# Patient Record
Sex: Female | Born: 1995 | Race: Black or African American | Hispanic: No | Marital: Single | Smoking: Never smoker
Health system: Southern US, Community
[De-identification: ages and names within clinical notes are randomized; demographics above are authoritative.]

## PROBLEM LIST (undated history)

## (undated) DIAGNOSIS — O039 Complete or unspecified spontaneous abortion without complication: Secondary | ICD-10-CM

## (undated) DIAGNOSIS — D4989 Neoplasm of unspecified behavior of other specified sites: Secondary | ICD-10-CM

## (undated) DIAGNOSIS — J302 Other seasonal allergic rhinitis: Secondary | ICD-10-CM

## (undated) DIAGNOSIS — I10 Essential (primary) hypertension: Secondary | ICD-10-CM

## (undated) DIAGNOSIS — K5900 Constipation, unspecified: Secondary | ICD-10-CM

## (undated) HISTORY — DX: Neoplasm of unspecified behavior of other specified sites: D49.89

## (undated) HISTORY — PX: TUMOR REMOVAL: SHX12

## (undated) HISTORY — PX: APPENDECTOMY: SHX54

## (undated) HISTORY — PX: OTHER SURGICAL HISTORY: SHX169

## (undated) HISTORY — PX: ANKLE SURGERY: SHX546

## (undated) HISTORY — PX: PORTACATH PLACEMENT: SHX2246

## (undated) HISTORY — DX: Complete or unspecified spontaneous abortion without complication: O03.9

## (undated) HISTORY — PX: TONSILLECTOMY: SUR1361

## (undated) HISTORY — DX: Essential (primary) hypertension: I10

## (undated) HISTORY — PX: PORT-A-CATH REMOVAL: SHX5289

---
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
## 2017-07-07 DIAGNOSIS — S93402A Sprain of unspecified ligament of left ankle, initial encounter: Secondary | ICD-10-CM | POA: Diagnosis not present

## 2017-07-07 DIAGNOSIS — M25572 Pain in left ankle and joints of left foot: Secondary | ICD-10-CM | POA: Diagnosis not present

## 2017-07-07 DIAGNOSIS — S93492A Sprain of other ligament of left ankle, initial encounter: Secondary | ICD-10-CM | POA: Diagnosis not present

## 2017-07-29 DIAGNOSIS — S93492D Sprain of other ligament of left ankle, subsequent encounter: Secondary | ICD-10-CM | POA: Diagnosis not present

## 2018-06-10 DIAGNOSIS — Z793 Long term (current) use of hormonal contraceptives: Secondary | ICD-10-CM | POA: Diagnosis not present

## 2018-06-10 DIAGNOSIS — J309 Allergic rhinitis, unspecified: Secondary | ICD-10-CM | POA: Diagnosis not present

## 2018-06-10 DIAGNOSIS — R7309 Other abnormal glucose: Secondary | ICD-10-CM | POA: Diagnosis not present

## 2018-06-10 DIAGNOSIS — Z7251 High risk heterosexual behavior: Secondary | ICD-10-CM | POA: Diagnosis not present

## 2018-06-10 DIAGNOSIS — E785 Hyperlipidemia, unspecified: Secondary | ICD-10-CM | POA: Diagnosis not present

## 2018-07-27 DIAGNOSIS — M546 Pain in thoracic spine: Secondary | ICD-10-CM | POA: Diagnosis not present

## 2018-11-18 DIAGNOSIS — J9801 Acute bronchospasm: Secondary | ICD-10-CM | POA: Diagnosis not present

## 2018-11-18 DIAGNOSIS — J209 Acute bronchitis, unspecified: Secondary | ICD-10-CM | POA: Diagnosis not present

## 2018-11-18 DIAGNOSIS — J029 Acute pharyngitis, unspecified: Secondary | ICD-10-CM | POA: Diagnosis not present

## 2018-12-01 DIAGNOSIS — S61216A Laceration without foreign body of right little finger without damage to nail, initial encounter: Secondary | ICD-10-CM | POA: Diagnosis not present

## 2018-12-07 DIAGNOSIS — H66002 Acute suppurative otitis media without spontaneous rupture of ear drum, left ear: Secondary | ICD-10-CM | POA: Diagnosis not present

## 2018-12-07 DIAGNOSIS — J9801 Acute bronchospasm: Secondary | ICD-10-CM | POA: Diagnosis not present

## 2018-12-07 DIAGNOSIS — T50905A Adverse effect of unspecified drugs, medicaments and biological substances, initial encounter: Secondary | ICD-10-CM | POA: Diagnosis not present

## 2018-12-07 DIAGNOSIS — J029 Acute pharyngitis, unspecified: Secondary | ICD-10-CM | POA: Diagnosis not present

## 2018-12-28 DIAGNOSIS — S61216D Laceration without foreign body of right little finger without damage to nail, subsequent encounter: Secondary | ICD-10-CM | POA: Diagnosis not present

## 2019-02-26 DIAGNOSIS — N6089 Other benign mammary dysplasias of unspecified breast: Secondary | ICD-10-CM | POA: Diagnosis not present

## 2019-03-11 DIAGNOSIS — L089 Local infection of the skin and subcutaneous tissue, unspecified: Secondary | ICD-10-CM | POA: Diagnosis not present

## 2019-03-11 DIAGNOSIS — L720 Epidermal cyst: Secondary | ICD-10-CM | POA: Diagnosis not present

## 2019-04-27 DIAGNOSIS — L720 Epidermal cyst: Secondary | ICD-10-CM | POA: Diagnosis not present

## 2019-06-21 ENCOUNTER — Other Ambulatory Visit: Payer: Self-pay

## 2019-06-21 ENCOUNTER — Ambulatory Visit (INDEPENDENT_AMBULATORY_CARE_PROVIDER_SITE_OTHER): Payer: Medicaid Other | Admitting: Family Medicine

## 2019-06-21 ENCOUNTER — Encounter: Payer: Self-pay | Admitting: Family Medicine

## 2019-06-21 VITALS — BP 106/75 | HR 81 | Temp 98.6°F | Ht 63.0 in | Wt 251.6 lb

## 2019-06-21 DIAGNOSIS — Z7689 Persons encountering health services in other specified circumstances: Secondary | ICD-10-CM | POA: Diagnosis not present

## 2019-06-21 DIAGNOSIS — K5900 Constipation, unspecified: Secondary | ICD-10-CM

## 2019-06-21 DIAGNOSIS — Z114 Encounter for screening for human immunodeficiency virus [HIV]: Secondary | ICD-10-CM

## 2019-06-21 DIAGNOSIS — Z1272 Encounter for screening for malignant neoplasm of vagina: Secondary | ICD-10-CM

## 2019-06-21 DIAGNOSIS — J302 Other seasonal allergic rhinitis: Secondary | ICD-10-CM

## 2019-06-21 DIAGNOSIS — Z111 Encounter for screening for respiratory tuberculosis: Secondary | ICD-10-CM

## 2019-06-21 DIAGNOSIS — Z0000 Encounter for general adult medical examination without abnormal findings: Secondary | ICD-10-CM

## 2019-06-21 DIAGNOSIS — Z00121 Encounter for routine child health examination with abnormal findings: Secondary | ICD-10-CM | POA: Diagnosis not present

## 2019-06-21 HISTORY — DX: Constipation, unspecified: K59.00

## 2019-06-21 HISTORY — DX: Other seasonal allergic rhinitis: J30.2

## 2019-06-21 NOTE — Progress Notes (Signed)
New Patient Office Visit  Assessment & Plan:  1. Annual physical exam - Preventive care education provided. Immunizations UTD.   2. Screening-pulmonary TB - TB Skin Test  3. Seasonal allergies - Well controlled on current regimen of Flonase and Claritin.   4. Constipation, unspecified constipation type - Well controlled on current regimen of Miralax PRN.   5. Encounter for screening for HIV - HIV Antibody (routine testing w rflx)  6. Vaginal Pap smear - Patient would like to have pap smear with OBGYN, she will be due for her first pap smear in October.  - Ambulatory referral to Obstetrics / Gynecology  7. Encounter to establish care   Follow-up: Return in about 1 year (around 06/20/2020) for annual physical.   Hendricks Limes, MSN, APRN, FNP-C [facility]  Subjective:  Patient ID: Jean Randall, female    DOB: 30-Apr-1996  Age: 23 y.o. MRN: [id number]  Patient Care Team: Loman Brooklyn, FNP as PCP - General (Family Medicine)  CC:  Chief Complaint  Patient presents with  . New Patient (Initial Visit)  . Employment Physical    HPI Jean Randall presents to establish care. Patient is transferring from [employer] in [place]. Patient also has work forms to be completed.   Uses Albuterol as needed. It was prescribed when she had bronchitis earlier this year.   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    Review of Systems  Constitutional: Negative for chills, fever, malaise/fatigue and weight loss.  HENT: Negative for congestion, ear discharge, ear pain, nosebleeds, sinus pain, sore throat and tinnitus.   Eyes: Negative for blurred vision, double vision, pain, discharge and redness.  Respiratory: Negative for cough, shortness of breath and wheezing.   Cardiovascular: Negative for chest pain, palpitations and leg swelling.  Gastrointestinal: Positive for constipation and nausea (before bowel movements).  Negative for abdominal pain, diarrhea, heartburn and vomiting.  Genitourinary: Negative for dysuria, frequency and urgency.  Musculoskeletal: Negative for myalgias.  Skin: Negative for rash.  Neurological: Negative for dizziness, seizures, weakness and headaches.  Psychiatric/Behavioral: Negative for depression, substance abuse and suicidal ideas. The patient is not nervous/anxious.      Current Outpatient Medications:  .  albuterol (PROVENTIL HFA) 108 (90 Base) MCG/ACT inhaler, INHALE 2 PUFFS INTO LUNGS EVERY 4 HOURS AS NEEDED FOR WHEEZING, COUGH, OR SHORTNESS OF BREATH, Disp: , Rfl:  .  B Complex Vitamins (VITAMIN B-COMPLEX) TABS, Take by mouth., Disp: , Rfl:  .  fluticasone (FLONASE) 50 MCG/ACT nasal spray, , Disp: , Rfl:  .  loratadine (CLARITIN) 10 MG tablet, TAKE 1 TABLET BY MOUTH EVERY DAY, Disp: , Rfl:  .  Polyethylene Glycol 3350 (PEG 3350) 17 GM/SCOOP POWD, , Disp: , Rfl:   Allergies  Allergen Reactions  . Doxorubicin Hcl Anaphylaxis  . Cefzil [Cefprozil]   . Paclitaxel Other (See Comments)    Hypersensitivity reaction Hypersensitivity reaction   . Amoxicillin Rash  . Penicillins Rash    Past Medical History:  Diagnosis Date  . Constipation 06/21/2019  . Hypertension   . Intra-abdominal tumor    Removed at 23 years of age; benign.   . Seasonal allergies 06/21/2019    Past Surgical History:  Procedure Laterality Date  . ANKLE SURGERY    . PORT-A-CATH REMOVAL    . PORTACATH PLACEMENT    . TONSILLECTOMY    . TUMOR REMOVAL     from abdomen  History reviewed. No pertinent family history.  Social History   Socioeconomic History  . Marital status: Single    Spouse name: Not on file  . Number of children: Not on file  . Years of education: Not on file  . Highest education level: Not on file  Occupational History  . Not on file  Social Needs  . Financial resource strain: Not on file  . Food insecurity    Worry: Not on file    Inability: Not on file  .  Transportation needs    Medical: Not on file    Non-medical: Not on file  Tobacco Use  . Smoking status: Never Smoker  . Smokeless tobacco: Never Used  Substance and Sexual Activity  . Alcohol use: Never    Frequency: Never  . Drug use: Never  . Sexual activity: Not on file  Lifestyle  . Physical activity    Days per week: Not on file    Minutes per session: Not on file  . Stress: Not on file  Relationships  . Social Herbalist on phone: Not on file    Gets together: Not on file    Attends religious service: Not on file    Active member of club or organization: Not on file    Attends meetings of clubs or organizations: Not on file    Relationship status: Not on file  . Intimate partner violence    Fear of current or ex partner: Not on file    Emotionally abused: Not on file    Physically abused: Not on file    Forced sexual activity: Not on file  Other Topics Concern  . Not on file  Social History Narrative  . Not on file    Objective:   Today's Vitals: BP 106/75   Pulse 81   Temp 98.6 F (37 C) (Temporal)   Ht 5\' 3"  (1.6 m)   Wt 251 lb 9.6 oz (114.1 kg)   LMP 06/12/2019   BMI 44.57 kg/m   Physical Exam Vitals signs reviewed.  Constitutional:      General: She is not in acute distress.    Appearance: Normal appearance. She is morbidly obese. She is not ill-appearing, toxic-appearing or diaphoretic.  HENT:     Head: Normocephalic and atraumatic.     Right Ear: Tympanic membrane, ear canal and external ear normal. There is no impacted cerumen.     Left Ear: Tympanic membrane, ear canal and external ear normal. There is no impacted cerumen.     Nose: Nose normal. No congestion or rhinorrhea.     Mouth/Throat:     Mouth: Mucous membranes are moist.     Pharynx: Oropharynx is clear. No oropharyngeal exudate or posterior oropharyngeal erythema.  Eyes:     General: No scleral icterus.       Right eye: No discharge.        Left eye: No discharge.      Conjunctiva/sclera: Conjunctivae normal.     Pupils: Pupils are equal, round, and reactive to light.  Neck:     Musculoskeletal: Normal range of motion and neck supple. No neck rigidity or muscular tenderness.  Cardiovascular:     Rate and Rhythm: Normal rate and regular rhythm.     Heart sounds: Normal heart sounds. No murmur. No friction rub. No gallop.   Pulmonary:     Effort: Pulmonary effort is normal. No respiratory distress.     Breath sounds: Normal breath  sounds. No stridor. No wheezing, rhonchi or rales.  Abdominal:     General: Abdomen is flat. Bowel sounds are normal. There is no distension.     Palpations: Abdomen is soft. There is no mass.     Tenderness: There is no abdominal tenderness. There is no guarding or rebound.     Hernia: No hernia is present.  Musculoskeletal: Normal range of motion.  Lymphadenopathy:     Cervical: No cervical adenopathy.  Skin:    General: Skin is warm and dry.     Capillary Refill: Capillary refill takes less than 2 seconds.  Neurological:     General: No focal deficit present.     Mental Status: She is alert and oriented to person, place, and time. Mental status is at baseline.  Psychiatric:        Mood and Affect: Mood normal.        Behavior: Behavior normal.        Thought Content: Thought content normal.        Judgment: Judgment normal.

## 2019-06-21 NOTE — Patient Instructions (Signed)
Preventive Care 21-21 Years Old, Female Preventive care refers to lifestyle choices and visits with your health care provider that can promote health and wellness. At this stage in your life, you may start seeing a primary care physician instead of a pediatrician. Your health care is now your responsibility. Preventive care for young adults includes:  A yearly physical exam. This is also called an annual wellness visit.  Regular dental and eye exams.  Immunizations.  Screening for certain conditions.  Healthy lifestyle choices, such as diet and exercise. What can I expect for my preventive care visit? Physical exam Your health care provider may check:  Height and weight. These may be used to calculate body mass index (BMI), which is a measurement that tells if you are at a healthy weight.  Heart rate and blood pressure.  Body temperature. Counseling Your health care provider may ask you questions about:  Past medical problems and family medical history.  Alcohol, tobacco, and drug use.  Home and relationship well-being.  Access to firearms.  Emotional well-being.  Diet, exercise, and sleep habits.  Sexual activity and sexual health.  Method of birth control.  Menstrual cycle.  Pregnancy history. What immunizations do I need?  Influenza (flu) vaccine  This is recommended every year. Tetanus, diphtheria, and pertussis (Tdap) vaccine  You may need a Td booster every 10 years. Varicella (chickenpox) vaccine  You may need this vaccine if you have not already been vaccinated. Human papillomavirus (HPV) vaccine  If recommended by your health care provider, you may need three doses over 6 months. Measles, mumps, and rubella (MMR) vaccine  You may need at least one dose of MMR. You may also need a second dose. Meningococcal conjugate (MenACWY) vaccine  One dose is recommended if you are 19-80 years old and a Market researcher living in a residence hall,  or if you have one of several medical conditions. You may also need additional booster doses. Pneumococcal conjugate (PCV13) vaccine  You may need this if you have certain conditions and were not previously vaccinated. Pneumococcal polysaccharide (PPSV23) vaccine  You may need one or two doses if you smoke cigarettes or if you have certain conditions. Hepatitis A vaccine  You may need this if you have certain conditions or if you travel or work in places where you may be exposed to hepatitis A. Hepatitis B vaccine  You may need this if you have certain conditions or if you travel or work in places where you may be exposed to hepatitis B. Haemophilus influenzae type b (Hib) vaccine  You may need this if you have certain risk factors. You may receive vaccines as individual doses or as more than one vaccine together in one shot (combination vaccines). Talk with your health care provider about the risks and benefits of combination vaccines. What tests do I need? Blood tests  Lipid and cholesterol levels. These may be checked every 5 years starting at age 23.  Hepatitis C test.  Hepatitis B test. Screening  Pelvic exam and Pap test. This may be done every 3 years starting at age 43.  Sexually transmitted disease (STD) testing, if you are at risk.  BRCA-related cancer screening. This may be done if you have a family history of breast, ovarian, tubal, or peritoneal cancers. Other tests  Tuberculosis skin test.  Vision and hearing tests.  Skin exam.  Breast exam. Follow these instructions at home: Eating and drinking   Eat a diet that includes fresh fruits and  vegetables, whole grains, lean protein, and low-fat dairy products.  Drink enough fluid to keep your urine pale yellow.  Do not drink alcohol if: ? Your health care provider tells you not to drink. ? You are pregnant, may be pregnant, or are planning to become pregnant. ? You are under the legal drinking age. In the  [place] the legal drinking age is 21.  If you drink alcohol: ? Limit how much you have to 0-1 drink a day. ? Be aware of how much alcohol is in your drink. In the [place] one drink equals one 12 oz bottle of beer (355 mL), one 5 oz glass of wine (148 mL), or one 1 oz glass of hard liquor (44 mL). Lifestyle  Take daily care of your teeth and gums.  Stay active. Exercise at least 30 minutes 5 or more days of the week.  Do not use any products that contain nicotine or tobacco, such as cigarettes, e-cigarettes, and chewing tobacco. If you need help quitting, ask your health care provider.  Do not use drugs.  If you are sexually active, practice safe sex. Use a condom or other form of birth control (contraception) in order to prevent pregnancy and STIs (sexually transmitted infections). If you plan to become pregnant, see your health care provider for a pre-conception visit.  Find healthy ways to cope with stress, such as: ? Meditation, yoga, or listening to music. ? Journaling. ? Talking to a trusted person. ? Spending time with friends and family. Safety  Always wear your seat belt while driving or riding in a vehicle.  Do not drive if you have been drinking alcohol. Do not ride with someone who has been drinking.  Do not drive when you are tired or distracted. Do not text while driving.  Wear a helmet and other protective equipment during sports activities.  If you have firearms in your house, make sure you follow all gun safety procedures.  Seek help if you have been bullied, physically abused, or sexually abused.  Use the Internet responsibly to avoid dangers such as online bullying and online sex predators. What's next?  Go to your health care provider once a year for a well check visit.  Ask your health care provider how often you should have your eyes and teeth checked.  Stay up to date on all vaccines. This information is not intended to replace advice given to you by  your health care provider. Make sure you discuss any questions you have with your health care provider. Document Released: 02/29/2016 Document Revised: 10/08/2018 Document Reviewed: 10/08/2018 Elsevier Patient Education  2020 [employer] Inc[employer]  

## 2019-06-22 LAB — HIV ANTIBODY (ROUTINE TESTING W REFLEX): HIV Screen 4th Generation wRfx: NONREACTIVE

## 2019-06-24 ENCOUNTER — Encounter: Payer: Self-pay | Admitting: Family Medicine

## 2019-07-21 ENCOUNTER — Telehealth: Payer: Self-pay | Admitting: Advanced Practice Midwife

## 2019-07-21 NOTE — Telephone Encounter (Signed)
Called patient regarding appointment and the following message was left:   We have you scheduled for an upcoming appointment at our office. At this time, patients are encouraged to come alone to their visits whenever possible, however, a support person, over age 21, may accompany you to your appointment if assistance is needed for safety or care concerns. Otherwise, support persons should remain outside until the visit is complete.   We ask if you have had any exposure to anyone suspected or confirmed of having COVID-19 or if you are experiencing any of the following, to call and reschedule your appointment: fever, cough, shortness of breath, muscle pain, diarrhea, rash, vomiting, abdominal pain, red eye, weakness, bruising, bleeding, joint pain, or a severe headache.   Please know we will ask you these questions or similar questions when you arrive for your appointment and again its how we are keeping everyone safe.    Also,to keep you safe, please use the provided hand sanitizer when you enter the office. We are asking everyone in the office to wear a mask to help prevent the spread of germs. If you have a mask of your own, please wear it to your appointment, if not, we are happy to provide one for you.  Thank you for understanding and your cooperation.    [facility] Staff

## 2019-07-22 ENCOUNTER — Other Ambulatory Visit: Payer: Self-pay

## 2019-07-22 ENCOUNTER — Encounter: Payer: Self-pay | Admitting: Advanced Practice Midwife

## 2019-07-22 ENCOUNTER — Ambulatory Visit: Payer: Medicaid Other | Admitting: Advanced Practice Midwife

## 2019-08-02 DIAGNOSIS — H5213 Myopia, bilateral: Secondary | ICD-10-CM | POA: Diagnosis not present

## 2019-08-17 DIAGNOSIS — H5203 Hypermetropia, bilateral: Secondary | ICD-10-CM | POA: Diagnosis not present

## 2019-08-17 DIAGNOSIS — H52223 Regular astigmatism, bilateral: Secondary | ICD-10-CM | POA: Diagnosis not present

## 2019-08-27 ENCOUNTER — Telehealth: Payer: Self-pay | Admitting: Advanced Practice Midwife

## 2019-08-27 NOTE — Telephone Encounter (Signed)
Tried to reach the patient to remind her of her appointment/restrictions, call can not be completed at this time. °

## 2019-08-28 DIAGNOSIS — S8991XA Unspecified injury of right lower leg, initial encounter: Secondary | ICD-10-CM | POA: Diagnosis not present

## 2019-08-28 DIAGNOSIS — S83401A Sprain of unspecified collateral ligament of right knee, initial encounter: Secondary | ICD-10-CM | POA: Diagnosis not present

## 2019-08-28 DIAGNOSIS — M25561 Pain in right knee: Secondary | ICD-10-CM | POA: Diagnosis not present

## 2019-08-30 ENCOUNTER — Encounter: Payer: Medicaid Other | Admitting: Advanced Practice Midwife

## 2019-09-04 ENCOUNTER — Other Ambulatory Visit: Payer: Self-pay | Admitting: Pediatrics

## 2019-09-06 NOTE — Telephone Encounter (Signed)
Please send this patient a discharge letter.  Thank you

## 2019-09-06 NOTE — Telephone Encounter (Signed)
Discharge letter sent Feb. 2020. Her PCP is now showing as Hendricks Limes, FNP.

## 2019-12-17 ENCOUNTER — Telehealth: Payer: Self-pay | Admitting: Adult Health

## 2019-12-17 NOTE — Telephone Encounter (Signed)
Tried to reach the patient to remind her of her appointment/restrictions, line stayed busy.

## 2019-12-20 ENCOUNTER — Other Ambulatory Visit: Payer: Self-pay

## 2019-12-20 ENCOUNTER — Encounter: Payer: Medicaid Other | Admitting: Adult Health

## 2019-12-21 NOTE — Progress Notes (Signed)
This encounter was created in error - please disregard.

## 2019-12-23 ENCOUNTER — Telehealth: Payer: Self-pay | Admitting: Adult Health

## 2019-12-23 NOTE — Telephone Encounter (Signed)

## 2019-12-24 ENCOUNTER — Other Ambulatory Visit (HOSPITAL_COMMUNITY)
Admission: RE | Admit: 2019-12-24 | Discharge: 2019-12-24 | Disposition: A | Discharge status: Home / Self Care | Payer: Medicaid Other | Source: Ambulatory Visit | Attending: Adult Health | Admitting: Adult Health

## 2019-12-24 ENCOUNTER — Ambulatory Visit (INDEPENDENT_AMBULATORY_CARE_PROVIDER_SITE_OTHER): Payer: Medicaid Other | Admitting: Adult Health

## 2019-12-24 ENCOUNTER — Other Ambulatory Visit: Payer: Self-pay

## 2019-12-24 ENCOUNTER — Encounter: Payer: Self-pay | Admitting: Adult Health

## 2019-12-24 VITALS — BP 153/84 | HR 85 | Ht 62.0 in | Wt 242.0 lb

## 2019-12-24 DIAGNOSIS — Z113 Encounter for screening for infections with a predominantly sexual mode of transmission: Secondary | ICD-10-CM | POA: Diagnosis not present

## 2019-12-24 DIAGNOSIS — Z3201 Encounter for pregnancy test, result positive: Secondary | ICD-10-CM | POA: Insufficient documentation

## 2019-12-24 DIAGNOSIS — Z3A01 Less than 8 weeks gestation of pregnancy: Secondary | ICD-10-CM

## 2019-12-24 DIAGNOSIS — Z01419 Encounter for gynecological examination (general) (routine) without abnormal findings: Secondary | ICD-10-CM

## 2019-12-24 DIAGNOSIS — Z3009 Encounter for other general counseling and advice on contraception: Secondary | ICD-10-CM | POA: Diagnosis not present

## 2019-12-24 DIAGNOSIS — Z124 Encounter for screening for malignant neoplasm of cervix: Secondary | ICD-10-CM | POA: Insufficient documentation

## 2019-12-24 DIAGNOSIS — O3680X0 Pregnancy with inconclusive fetal viability, not applicable or unspecified: Secondary | ICD-10-CM

## 2019-12-24 LAB — POCT URINE PREGNANCY: Preg Test, Ur: POSITIVE — AB

## 2019-12-24 MED ORDER — PRENATAL PLUS 27-1 MG PO TABS: 1.0000 | ORAL_TABLET | Freq: Every day | ORAL | 12 refills | Status: AC

## 2019-12-24 NOTE — Patient Instructions (Signed)
First Trimester of Pregnancy The first trimester of pregnancy is from week 1 until the end of week 13 (months 1 through 3). A week after a sperm fertilizes an egg, the egg will implant on the wall of the uterus. This embryo will begin to develop into a baby. Genes from you and your partner will form the baby. The female genes will determine whether the baby will be a boy or a girl. At 6-8 weeks, the eyes and face will be formed, and the heartbeat can be seen on ultrasound. At the end of 12 weeks, all the baby's organs will be formed. Now that you are pregnant, you will want to do everything you can to have a healthy baby. Two of the most important things are to get good prenatal care and to follow your health care provider's instructions. Prenatal care is all the medical care you receive before the baby's birth. This care will help prevent, find, and treat any problems during the pregnancy and childbirth. Body changes during your first trimester Your body goes through many changes during pregnancy. The changes vary from woman to woman.  You may gain or lose a couple of pounds at first.  You may feel sick to your stomach (nauseous) and you may throw up (vomit). If the vomiting is uncontrollable, call your health care provider.  You may tire easily.  You may develop headaches that can be relieved by medicines. All medicines should be approved by your health care provider.  You may urinate more often. Painful urination may mean you have a bladder infection.  You may develop heartburn as a result of your pregnancy.  You may develop constipation because certain hormones are causing the muscles that push stool through your intestines to slow down.  You may develop hemorrhoids or swollen veins (varicose veins).  Your breasts may begin to grow larger and become tender. Your nipples may stick out more, and the tissue that surrounds them (areola) may become darker.  Your gums may bleed and may be  sensitive to brushing and flossing.  Dark spots or blotches (chloasma, mask of pregnancy) may develop on your face. This will likely fade after the baby is born.  Your menstrual periods will stop.  You may have a loss of appetite.  You may develop cravings for certain kinds of food.  You may have changes in your emotions from day to day, such as being excited to be pregnant or being concerned that something may go wrong with the pregnancy and baby.  You may have more vivid and strange dreams.  You may have changes in your hair. These can include thickening of your hair, rapid growth, and changes in texture. Some women also have hair loss during or after pregnancy, or hair that feels dry or thin. Your hair will most likely return to normal after your baby is born. What to expect at prenatal visits During a routine prenatal visit:  You will be weighed to make sure you and the baby are growing normally.  Your blood pressure will be taken.  Your abdomen will be measured to track your baby's growth.  The fetal heartbeat will be listened to between weeks 10 and 14 of your pregnancy.  Test results from any previous visits will be discussed. Your health care provider may ask you:  How you are feeling.  If you are feeling the baby move.  If you have had any abnormal symptoms, such as leaking fluid, bleeding, severe headaches, or abdominal   cramping.  If you are using any tobacco products, including cigarettes, chewing tobacco, and electronic cigarettes.  If you have any questions. Other tests that may be performed during your first trimester include:  Blood tests to find your blood type and to check for the presence of any previous infections. The tests will also be used to check for low iron levels (anemia) and protein on red blood cells (Rh antibodies). Depending on your risk factors, or if you previously had diabetes during pregnancy, you may have tests to check for high blood sugar  that affects pregnant women (gestational diabetes).  Urine tests to check for infections, diabetes, or protein in the urine.  An ultrasound to confirm the proper growth and development of the baby.  Fetal screens for spinal cord problems (spina bifida) and Down syndrome.  HIV (human immunodeficiency virus) testing. Routine prenatal testing includes screening for HIV, unless you choose not to have this test.  You may need other tests to make sure you and the baby are doing well. Follow these instructions at home: Medicines  Follow your health care provider's instructions regarding medicine use. Specific medicines may be either safe or unsafe to take during pregnancy.  Take a prenatal vitamin that contains at least 600 micrograms (mcg) of folic acid.  If you develop constipation, try taking a stool softener if your health care provider approves. Eating and drinking   Eat a balanced diet that includes fresh fruits and vegetables, whole grains, good sources of protein such as meat, eggs, or tofu, and low-fat dairy. Your health care provider will help you determine the amount of weight gain that is right for you.  Avoid raw meat and uncooked cheese. These carry germs that can cause birth defects in the baby.  Eating four or five small meals rather than three large meals a day may help relieve nausea and vomiting. If you start to feel nauseous, eating a few soda crackers can be helpful. Drinking liquids between meals, instead of during meals, also seems to help ease nausea and vomiting.  Limit foods that are high in fat and processed sugars, such as fried and sweet foods.  To prevent constipation: ? Eat foods that are high in fiber, such as fresh fruits and vegetables, whole grains, and beans. ? Drink enough fluid to keep your urine clear or pale yellow. Activity  Exercise only as directed by your health care provider. Most women can continue their usual exercise routine during  pregnancy. Try to exercise for 30 minutes at least 5 days a week. Exercising will help you: ? Control your weight. ? Stay in shape. ? Be prepared for labor and delivery.  Experiencing pain or cramping in the lower abdomen or lower back is a good sign that you should stop exercising. Check with your health care provider before continuing with normal exercises.  Try to avoid standing for long periods of time. Move your legs often if you must stand in one place for a long time.  Avoid heavy lifting.  Wear low-heeled shoes and practice good posture.  You may continue to have sex unless your health care provider tells you not to. Relieving pain and discomfort  Wear a good support bra to relieve breast tenderness.  Take warm sitz baths to soothe any pain or discomfort caused by hemorrhoids. Use hemorrhoid cream if your health care provider approves.  Rest with your legs elevated if you have leg cramps or low back pain.  If you develop varicose veins in   your legs, wear support hose. Elevate your feet for 15 minutes, 3-4 times a day. Limit salt in your diet. Prenatal care  Schedule your prenatal visits by the twelfth week of pregnancy. They are usually scheduled monthly at first, then more often in the last 2 months before delivery.  Write down your questions. Take them to your prenatal visits.  Keep all your prenatal visits as told by your health care provider. This is important. Safety  Wear your seat belt at all times when driving.  Make a list of emergency phone numbers, including numbers for family, friends, the hospital, and police and fire departments. General instructions  Ask your health care provider for a referral to a local prenatal education class. Begin classes no later than the beginning of month 6 of your pregnancy.  Ask for help if you have counseling or nutritional needs during pregnancy. Your health care provider can offer advice or refer you to specialists for help  with various needs.  Do not use hot tubs, steam rooms, or saunas.  Do not douche or use tampons or scented sanitary pads.  Do not cross your legs for long periods of time.  Avoid cat litter boxes and soil used by cats. These carry germs that can cause birth defects in the baby and possibly loss of the fetus by miscarriage or stillbirth.  Avoid all smoking, herbs, alcohol, and medicines not prescribed by your health care provider. Chemicals in these products affect the formation and growth of the baby.  Do not use any products that contain nicotine or tobacco, such as cigarettes and e-cigarettes. If you need help quitting, ask your health care provider. You may receive counseling support and other resources to help you quit.  Schedule a dentist appointment. At home, brush your teeth with a soft toothbrush and be gentle when you floss. Contact a health care provider if:  You have dizziness.  You have mild pelvic cramps, pelvic pressure, or nagging pain in the abdominal area.  You have persistent nausea, vomiting, or diarrhea.  You have a bad smelling vaginal discharge.  You have pain when you urinate.  You notice increased swelling in your face, hands, legs, or ankles.  You are exposed to fifth disease or chickenpox.  You are exposed to German measles (rubella) and have never had it. Get help right away if:  You have a fever.  You are leaking fluid from your vagina.  You have spotting or bleeding from your vagina.  You have severe abdominal cramping or pain.  You have rapid weight gain or loss.  You vomit blood or material that looks like coffee grounds.  You develop a severe headache.  You have shortness of breath.  You have any kind of trauma, such as from a fall or a car accident. Summary  The first trimester of pregnancy is from week 1 until the end of week 13 (months 1 through 3).  Your body goes through many changes during pregnancy. The changes vary from  woman to woman.  You will have routine prenatal visits. During those visits, your health care provider will examine you, discuss any test results you may have, and talk with you about how you are feeling. This information is not intended to replace advice given to you by your health care provider. Make sure you discuss any questions you have with your health care provider. Document Revised: 09/26/2017 Document Reviewed: 09/25/2016 Elsevier Patient Education  2020 Elsevier Inc.  

## 2019-12-24 NOTE — Progress Notes (Signed)
Patient ID: Jean Randall, female   DOB: 03-25-1996, 24 y.o.   MRN: [id number] History of Present Illness: Jean Randall is a 24 year old black female, single,in for well woman gyn exam and first pap and UPT. 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 PCP is Hendricks Limes NP.   Current Medications, Allergies, Past Medical History, Past Surgical History, Family History and Social History were reviewed in [employer] record.     Review of Systems: +missed period with 2+HPTs +nausea  Patient denies any headaches, hearing loss, fatigue, blurred vision, shortness of breath, chest pain, abdominal pain, problems with bowel movements, urination, or intercourse. No joint pain or mood swings.    Physical Exam:BP (!) 153/84 (BP Location: Left Wrist, Patient Position: Sitting, Cuff Size: Normal)   Pulse 85   Ht 5\' 2"  (1.575 m)   Wt 242 lb (109.8 kg)   LMP 11/05/2019 (Exact Date)   BMI 44.26 kg/m UPT is + about 7 weeks by LMP with EDD 08/11/20. 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 Pelvic:  External genitalia is normal in appearance, no lesions.  The vagina is normal in appearance. Urethra has no lesions or masses. The cervix is smooth, pap with GC/CHL and reflex HPV performed. Marland Kitchen  Uterus is felt to be normal size, shape, and contour.  No adnexal masses or tenderness noted.Bladder is non tender, no masses felt. Extremities/musculoskeletal:  No swelling or varicosities noted, no clubbing or cyanosis Psych:  No mood changes, alert and cooperative,seems happy Fall risk is low PHQ 2 score is 0 Examination chaperoned by Rolena Infante LPN   Impression and Plan:  1. Positive urine pregnancy test She is not sure if she will continue or not  2. Routine cervical smear  3. Encounter for gynecological examination with Papanicolaou smear of cervix Pap sent Physical in 1 year Pap in 3 if niormal   4. Family planning Check HIV and RPR  5. Screening examination for STD (sexually transmitted disease) Check HIV and RPR  6. Less than [redacted] weeks gestation of pregnancy Eat often Review handout on First trimester and by [facility]  Meds ordered this encounter  Medications  . prenatal vitamin w/FE, FA (PRENATAL 1 + 1) 27-1 MG TABS tablet    Sig: Take 1 tablet by mouth daily at 12 noon.    Dispense:  30 tablet    Refill:  12    Order Specific Question:   Supervising Provider    Answer:   Elonda Husky, LUTHER H [[id number]]    7. Encounter to determine fetal viability of pregnancy, single or unspecified fetus Dating [place] in about 2 weeks

## 2019-12-25 LAB — RPR: RPR Ser Ql: NONREACTIVE

## 2019-12-25 LAB — HIV ANTIBODY (ROUTINE TESTING W REFLEX): HIV Screen 4th Generation wRfx: NONREACTIVE

## 2019-12-27 LAB — CYTOLOGY - PAP
Chlamydia: NEGATIVE
Comment: NEGATIVE
Comment: NORMAL
Diagnosis: NEGATIVE
Neisseria Gonorrhea: NEGATIVE

## 2020-01-06 ENCOUNTER — Other Ambulatory Visit: Payer: Medicaid Other

## 2020-01-11 ENCOUNTER — Telehealth: Payer: Self-pay | Admitting: Obstetrics & Gynecology

## 2020-01-11 NOTE — Telephone Encounter (Signed)

## 2020-01-12 ENCOUNTER — Other Ambulatory Visit: Payer: Self-pay

## 2020-01-12 ENCOUNTER — Other Ambulatory Visit: Payer: Medicaid Other

## 2020-01-12 ENCOUNTER — Ambulatory Visit (INDEPENDENT_AMBULATORY_CARE_PROVIDER_SITE_OTHER): Payer: Self-pay

## 2020-01-12 ENCOUNTER — Other Ambulatory Visit: Payer: Self-pay | Admitting: Adult Health

## 2020-01-12 ENCOUNTER — Encounter: Payer: Self-pay | Admitting: Adult Health

## 2020-01-12 DIAGNOSIS — O3680X0 Pregnancy with inconclusive fetal viability, not applicable or unspecified: Secondary | ICD-10-CM

## 2020-01-12 DIAGNOSIS — O209 Hemorrhage in early pregnancy, unspecified: Secondary | ICD-10-CM

## 2020-01-12 DIAGNOSIS — Z3201 Encounter for pregnancy test, result positive: Secondary | ICD-10-CM

## 2020-01-12 DIAGNOSIS — Z3A09 9 weeks gestation of pregnancy: Secondary | ICD-10-CM

## 2020-01-12 NOTE — Progress Notes (Signed)
[place] TA/TV: complex thickened endometrium 17 mm,no IUP visualized,normal left ovary,right ovary not visualized (? Oophorectomy),labs ordered,Jennifer discussed result with patient   Chaperone [place]

## 2020-01-12 NOTE — Addendum Note (Signed)
Addended by: Linton Rump on: 01/12/2020 04:50 PM   Modules accepted: Orders

## 2020-01-13 LAB — ABO AND RH: Rh Factor: POSITIVE

## 2020-01-13 LAB — CBC
Hematocrit: 32.7 % — ABNORMAL LOW (ref 34.0–46.6)
Hemoglobin: 10.8 g/dL — ABNORMAL LOW (ref 11.1–15.9)
MCH: 27.6 pg (ref 26.6–33.0)
MCHC: 33 g/dL (ref 31.5–35.7)
MCV: 84 fL (ref 79–97)
Platelets: 462 10*3/uL — ABNORMAL HIGH (ref 150–450)
RBC: 3.91 x10E6/uL (ref 3.77–5.28)
RDW: 15 % (ref 11.7–15.4)
WBC: 7.4 10*3/uL (ref 3.4–10.8)

## 2020-01-13 LAB — BETA HCG QUANT (REF LAB): hCG Quant: 1413 m[IU]/mL

## 2020-01-14 ENCOUNTER — Telehealth: Payer: Self-pay | Admitting: Adult Health

## 2020-01-14 NOTE — Telephone Encounter (Signed)
Pt aware of labs take PNV with iron or get Flintstones complete and take 2 daily

## 2020-01-18 ENCOUNTER — Telehealth: Payer: Self-pay | Admitting: Adult Health

## 2020-01-18 DIAGNOSIS — O23591 Infection of other part of genital tract in pregnancy, first trimester: Secondary | ICD-10-CM | POA: Diagnosis not present

## 2020-01-18 DIAGNOSIS — N760 Acute vaginitis: Secondary | ICD-10-CM | POA: Diagnosis not present

## 2020-01-18 DIAGNOSIS — Z3A08 8 weeks gestation of pregnancy: Secondary | ICD-10-CM | POA: Diagnosis not present

## 2020-01-18 DIAGNOSIS — B9689 Other specified bacterial agents as the cause of diseases classified elsewhere: Secondary | ICD-10-CM | POA: Diagnosis not present

## 2020-01-18 DIAGNOSIS — R188 Other ascites: Secondary | ICD-10-CM | POA: Diagnosis not present

## 2020-01-18 DIAGNOSIS — Z20822 Contact with and (suspected) exposure to covid-19: Secondary | ICD-10-CM | POA: Diagnosis not present

## 2020-01-18 DIAGNOSIS — O26891 Other specified pregnancy related conditions, first trimester: Secondary | ICD-10-CM | POA: Diagnosis not present

## 2020-01-18 DIAGNOSIS — R1084 Generalized abdominal pain: Secondary | ICD-10-CM | POA: Diagnosis not present

## 2020-01-18 DIAGNOSIS — R918 Other nonspecific abnormal finding of lung field: Secondary | ICD-10-CM | POA: Diagnosis not present

## 2020-01-18 DIAGNOSIS — R1900 Intra-abdominal and pelvic swelling, mass and lump, unspecified site: Secondary | ICD-10-CM | POA: Diagnosis not present

## 2020-01-18 DIAGNOSIS — R509 Fever, unspecified: Secondary | ICD-10-CM | POA: Diagnosis not present

## 2020-01-18 NOTE — Telephone Encounter (Signed)

## 2020-01-19 ENCOUNTER — Ambulatory Visit: Payer: Medicaid Other | Admitting: Adult Health

## 2020-01-19 DIAGNOSIS — Z3A01 Less than 8 weeks gestation of pregnancy: Secondary | ICD-10-CM | POA: Diagnosis not present

## 2020-01-19 DIAGNOSIS — R1909 Other intra-abdominal and pelvic swelling, mass and lump: Secondary | ICD-10-CM | POA: Diagnosis not present

## 2020-01-19 DIAGNOSIS — R509 Fever, unspecified: Secondary | ICD-10-CM | POA: Diagnosis not present

## 2020-01-19 DIAGNOSIS — I10 Essential (primary) hypertension: Secondary | ICD-10-CM | POA: Diagnosis not present

## 2020-01-19 DIAGNOSIS — O039 Complete or unspecified spontaneous abortion without complication: Secondary | ICD-10-CM | POA: Diagnosis not present

## 2020-01-19 MED ORDER — PROCHLORPERAZINE EDISYLATE 10 MG/2ML IJ SOLN
5.00 | INTRAMUSCULAR | Status: DC
Start: ? — End: 2020-01-19

## 2020-01-19 MED ORDER — HYDROXYZINE HCL 25 MG PO TABS
25.00 | ORAL_TABLET | ORAL | Status: DC
Start: ? — End: 2020-01-19

## 2020-01-19 MED ORDER — ONDANSETRON HCL 4 MG/2ML IJ SOLN
4.00 | INTRAMUSCULAR | Status: DC
Start: ? — End: 2020-01-19

## 2020-01-19 MED ORDER — MORPHINE SULFATE 15 MG PO TABS
15.00 | ORAL_TABLET | ORAL | Status: DC
Start: ? — End: 2020-01-19

## 2020-01-19 MED ORDER — METRONIDAZOLE IN NACL 5-0.79 MG/ML-% IV SOLN
500.00 | INTRAVENOUS | Status: DC
Start: 2020-01-19 — End: 2020-01-19

## 2020-01-19 MED ORDER — SENNOSIDES-DOCUSATE SODIUM 8.6-50 MG PO TABS
1.00 | ORAL_TABLET | ORAL | Status: DC
Start: 2020-01-22 — End: 2020-01-19

## 2020-01-19 MED ORDER — ACETAMINOPHEN 500 MG PO TABS
1000.00 | ORAL_TABLET | ORAL | Status: DC
Start: 2020-01-22 — End: 2020-01-19

## 2020-01-19 MED ORDER — GENERIC EXTERNAL MEDICATION
15.00 | Status: DC
Start: 2020-01-19 — End: 2020-01-19

## 2020-01-19 MED ORDER — CIPROFLOXACIN IN D5W 400 MG/200ML IV SOLN
400.00 | INTRAVENOUS | Status: DC
Start: 2020-01-19 — End: 2020-01-19

## 2020-01-19 MED ORDER — LACTATED RINGERS IV SOLN
INTRAVENOUS | Status: DC
Start: ? — End: 2020-01-19

## 2020-01-19 MED ORDER — KETOROLAC TROMETHAMINE 30 MG/ML IJ SOLN
15.00 | INTRAMUSCULAR | Status: DC
Start: 2020-01-19 — End: 2020-01-19

## 2020-01-19 MED ORDER — INFLUENZA VAC SPLIT QUAD 0.5 ML IM SUSY
0.50 | PREFILLED_SYRINGE | INTRAMUSCULAR | Status: DC
Start: ? — End: 2020-01-19

## 2020-01-20 DIAGNOSIS — I10 Essential (primary) hypertension: Secondary | ICD-10-CM | POA: Diagnosis not present

## 2020-01-20 DIAGNOSIS — R509 Fever, unspecified: Secondary | ICD-10-CM | POA: Diagnosis not present

## 2020-01-20 DIAGNOSIS — R1909 Other intra-abdominal and pelvic swelling, mass and lump: Secondary | ICD-10-CM | POA: Diagnosis not present

## 2020-01-20 DIAGNOSIS — Z8759 Personal history of other complications of pregnancy, childbirth and the puerperium: Secondary | ICD-10-CM | POA: Diagnosis not present

## 2020-01-20 DIAGNOSIS — Z3A01 Less than 8 weeks gestation of pregnancy: Secondary | ICD-10-CM | POA: Diagnosis not present

## 2020-01-20 DIAGNOSIS — N760 Acute vaginitis: Secondary | ICD-10-CM | POA: Diagnosis not present

## 2020-01-20 DIAGNOSIS — O039 Complete or unspecified spontaneous abortion without complication: Secondary | ICD-10-CM | POA: Diagnosis not present

## 2020-01-21 DIAGNOSIS — R1909 Other intra-abdominal and pelvic swelling, mass and lump: Secondary | ICD-10-CM | POA: Diagnosis not present

## 2020-01-21 DIAGNOSIS — O039 Complete or unspecified spontaneous abortion without complication: Secondary | ICD-10-CM | POA: Diagnosis not present

## 2020-01-21 DIAGNOSIS — Z3A01 Less than 8 weeks gestation of pregnancy: Secondary | ICD-10-CM | POA: Diagnosis not present

## 2020-01-21 DIAGNOSIS — I10 Essential (primary) hypertension: Secondary | ICD-10-CM | POA: Diagnosis not present

## 2020-01-21 DIAGNOSIS — R509 Fever, unspecified: Secondary | ICD-10-CM | POA: Diagnosis not present

## 2020-01-22 MED ORDER — HYDROXYZINE HCL 25 MG PO TABS
50.00 | ORAL_TABLET | ORAL | Status: DC
Start: ? — End: 2020-01-22

## 2020-01-22 MED ORDER — MELATONIN 3 MG PO TABS
6.00 | ORAL_TABLET | ORAL | Status: DC
Start: ? — End: 2020-01-22

## 2020-01-22 MED ORDER — AMOXICILLIN 500 MG PO CAPS
500.00 | ORAL_CAPSULE | ORAL | Status: DC
Start: 2020-01-22 — End: 2020-01-22

## 2020-01-22 MED ORDER — METRONIDAZOLE 500 MG PO TABS
500.00 | ORAL_TABLET | ORAL | Status: DC
Start: 2020-01-22 — End: 2020-01-22

## 2020-01-24 ENCOUNTER — Telehealth: Payer: Self-pay | Admitting: Adult Health

## 2020-01-24 NOTE — Telephone Encounter (Signed)

## 2020-01-25 ENCOUNTER — Encounter: Payer: Self-pay | Admitting: Adult Health

## 2020-01-25 ENCOUNTER — Other Ambulatory Visit: Payer: Self-pay

## 2020-01-25 ENCOUNTER — Ambulatory Visit (INDEPENDENT_AMBULATORY_CARE_PROVIDER_SITE_OTHER): Payer: Self-pay | Admitting: Adult Health

## 2020-01-25 VITALS — BP 133/80 | HR 94 | Ht 62.0 in | Wt 241.0 lb

## 2020-01-25 DIAGNOSIS — Z3202 Encounter for pregnancy test, result negative: Secondary | ICD-10-CM

## 2020-01-25 DIAGNOSIS — R1900 Intra-abdominal and pelvic swelling, mass and lump, unspecified site: Secondary | ICD-10-CM | POA: Insufficient documentation

## 2020-01-25 DIAGNOSIS — O039 Complete or unspecified spontaneous abortion without complication: Secondary | ICD-10-CM | POA: Insufficient documentation

## 2020-01-25 LAB — POCT URINE PREGNANCY: Preg Test, Ur: NEGATIVE

## 2020-01-25 NOTE — Progress Notes (Signed)
  Subjective:     Patient ID: Jean Randall, female   DOB: 08-25-1996, 24 y.o.   MRN: [id number]  HPI Jean Randall is a 24 year old black female,single, sp recent miscarriage and was recently at [facility]. 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  PCP is Hendricks Limes NP   Review of Systems No bleeding Was recently at [facility] for infection 01/18/20 to 01/22/20  Reviewed past medical,surgical, social and family history. Reviewed medications and allergies.     Objective:   Physical Exam BP 133/80 (BP Location: Left Arm, Patient Position: Sitting, Cuff Size: Large)   Pulse 94   Ht 5\' 2"  (1.575 m)   Wt 241 lb (109.3 kg)   LMP 11/05/2019 (Exact Date)   Breastfeeding No   BMI 44.08 kg/m  UPT is negative. Fall risk is low PHQ 2 score is 0 Alcohol  audit is 1 Skin warm and dry. Lungs: clear to ausculation bilaterally. Cardiovascular: regular rate and rhythm. Tried reviewing care everywhere with her.     Assessment:     1. Miscarriage  2. Pelvic mass in female Has appt tomorrow at [facility] with oncology    Plan:     Follow up prn

## 2020-01-26 DIAGNOSIS — C569 Malignant neoplasm of unspecified ovary: Secondary | ICD-10-CM | POA: Diagnosis not present

## 2020-01-26 DIAGNOSIS — O3680X0 Pregnancy with inconclusive fetal viability, not applicable or unspecified: Secondary | ICD-10-CM | POA: Diagnosis not present

## 2020-01-31 ENCOUNTER — Encounter: Payer: Medicaid Other | Admitting: Nurse Practitioner

## 2020-02-07 DIAGNOSIS — O3680X0 Pregnancy with inconclusive fetal viability, not applicable or unspecified: Secondary | ICD-10-CM | POA: Diagnosis not present

## 2020-03-06 DIAGNOSIS — Z20828 Contact with and (suspected) exposure to other viral communicable diseases: Secondary | ICD-10-CM | POA: Diagnosis not present

## 2020-03-06 DIAGNOSIS — Z20822 Contact with and (suspected) exposure to covid-19: Secondary | ICD-10-CM | POA: Diagnosis not present

## 2020-03-10 DIAGNOSIS — C561 Malignant neoplasm of right ovary: Secondary | ICD-10-CM | POA: Diagnosis not present

## 2020-03-10 DIAGNOSIS — K660 Peritoneal adhesions (postprocedural) (postinfection): Secondary | ICD-10-CM | POA: Diagnosis not present

## 2020-03-10 DIAGNOSIS — C569 Malignant neoplasm of unspecified ovary: Secondary | ICD-10-CM | POA: Diagnosis not present

## 2020-03-29 DIAGNOSIS — Z48816 Encounter for surgical aftercare following surgery on the genitourinary system: Secondary | ICD-10-CM | POA: Diagnosis not present

## 2020-03-29 DIAGNOSIS — C569 Malignant neoplasm of unspecified ovary: Secondary | ICD-10-CM | POA: Diagnosis not present

## 2020-04-10 DIAGNOSIS — C569 Malignant neoplasm of unspecified ovary: Secondary | ICD-10-CM | POA: Diagnosis not present

## 2020-04-10 DIAGNOSIS — Z48816 Encounter for surgical aftercare following surgery on the genitourinary system: Secondary | ICD-10-CM | POA: Diagnosis not present

## 2020-05-24 DIAGNOSIS — Z202 Contact with and (suspected) exposure to infections with a predominantly sexual mode of transmission: Secondary | ICD-10-CM | POA: Diagnosis not present

## 2020-05-24 DIAGNOSIS — R110 Nausea: Secondary | ICD-10-CM | POA: Diagnosis not present

## 2020-05-31 DIAGNOSIS — Z202 Contact with and (suspected) exposure to infections with a predominantly sexual mode of transmission: Secondary | ICD-10-CM | POA: Diagnosis not present

## 2020-07-11 DIAGNOSIS — B9689 Other specified bacterial agents as the cause of diseases classified elsewhere: Secondary | ICD-10-CM | POA: Diagnosis not present

## 2020-07-11 DIAGNOSIS — N760 Acute vaginitis: Secondary | ICD-10-CM | POA: Diagnosis not present

## 2020-09-17 DIAGNOSIS — Z20822 Contact with and (suspected) exposure to covid-19: Secondary | ICD-10-CM | POA: Diagnosis not present

## 2020-09-17 DIAGNOSIS — R1084 Generalized abdominal pain: Secondary | ICD-10-CM | POA: Diagnosis not present

## 2020-09-17 DIAGNOSIS — H938X1 Other specified disorders of right ear: Secondary | ICD-10-CM | POA: Diagnosis not present

## 2020-09-17 DIAGNOSIS — Z3202 Encounter for pregnancy test, result negative: Secondary | ICD-10-CM | POA: Diagnosis not present

## 2020-09-17 DIAGNOSIS — R110 Nausea: Secondary | ICD-10-CM | POA: Diagnosis not present

## 2020-09-17 DIAGNOSIS — R0981 Nasal congestion: Secondary | ICD-10-CM | POA: Diagnosis not present

## 2020-09-19 ENCOUNTER — Telehealth: Payer: Self-pay

## 2020-09-19 NOTE — Telephone Encounter (Signed)
Transition Care Management Unsuccessful Follow-up Telephone Call  Date of discharge and from where:  09/18/2020 [facility]  Attempts:  1st Attempt  Reason for unsuccessful TCM follow-up call:  Unable to leave message

## 2020-09-24 DIAGNOSIS — R1084 Generalized abdominal pain: Secondary | ICD-10-CM | POA: Diagnosis not present

## 2020-09-25 ENCOUNTER — Telehealth: Payer: Self-pay

## 2020-09-25 NOTE — Telephone Encounter (Signed)
Transition Care Management Unsuccessful Follow-up Telephone Call  Date of discharge and from where:  09/24/2020 [facility]  Attempts:  2nd Attempt  Reason for unsuccessful TCM follow-up call:  Unable to reach patient

## 2020-09-26 NOTE — Telephone Encounter (Signed)
Transition Care Management Unsuccessful Follow-up Telephone Call  Date of discharge and from where:  09/24/2020 [facility]  Attempts:  3rd Attempt  Reason for unsuccessful TCM follow-up call:  Unable to reach patient

## 2020-10-11 DIAGNOSIS — I10 Essential (primary) hypertension: Secondary | ICD-10-CM | POA: Diagnosis not present

## 2020-10-11 DIAGNOSIS — E282 Polycystic ovarian syndrome: Secondary | ICD-10-CM | POA: Diagnosis not present

## 2020-10-11 DIAGNOSIS — Z6841 Body Mass Index (BMI) 40.0 and over, adult: Secondary | ICD-10-CM | POA: Diagnosis not present

## 2020-10-11 DIAGNOSIS — N83202 Unspecified ovarian cyst, left side: Secondary | ICD-10-CM | POA: Diagnosis not present

## 2020-10-11 DIAGNOSIS — E669 Obesity, unspecified: Secondary | ICD-10-CM | POA: Diagnosis not present

## 2020-10-18 ENCOUNTER — Other Ambulatory Visit: Payer: Self-pay | Admitting: Pediatrics

## 2020-10-18 DIAGNOSIS — N83202 Unspecified ovarian cyst, left side: Secondary | ICD-10-CM | POA: Diagnosis not present

## 2020-10-18 DIAGNOSIS — I10 Essential (primary) hypertension: Secondary | ICD-10-CM | POA: Diagnosis not present

## 2020-10-18 DIAGNOSIS — H65191 Other acute nonsuppurative otitis media, right ear: Secondary | ICD-10-CM | POA: Diagnosis not present

## 2020-10-18 DIAGNOSIS — Z6841 Body Mass Index (BMI) 40.0 and over, adult: Secondary | ICD-10-CM | POA: Diagnosis not present

## 2021-01-18 DIAGNOSIS — Z0000 Encounter for general adult medical examination without abnormal findings: Secondary | ICD-10-CM | POA: Diagnosis not present

## 2021-01-18 DIAGNOSIS — Z113 Encounter for screening for infections with a predominantly sexual mode of transmission: Secondary | ICD-10-CM | POA: Diagnosis not present

## 2021-01-18 DIAGNOSIS — Z01419 Encounter for gynecological examination (general) (routine) without abnormal findings: Secondary | ICD-10-CM | POA: Diagnosis not present

## 2021-01-29 ENCOUNTER — Encounter: Payer: Self-pay | Admitting: Pediatrics

## 2021-02-09 ENCOUNTER — Emergency Department (HOSPITAL_COMMUNITY)
Admission: EM | Admit: 2021-02-09 | Discharge: 2021-02-09 | Disposition: A | Discharge status: Home / Self Care | Payer: Medicaid Other | Attending: Emergency Medicine | Admitting: Emergency Medicine

## 2021-02-09 ENCOUNTER — Other Ambulatory Visit: Payer: Self-pay

## 2021-02-09 ENCOUNTER — Encounter (HOSPITAL_COMMUNITY): Payer: Self-pay

## 2021-02-09 DIAGNOSIS — S161XXA Strain of muscle, fascia and tendon at neck level, initial encounter: Secondary | ICD-10-CM | POA: Insufficient documentation

## 2021-02-09 DIAGNOSIS — I10 Essential (primary) hypertension: Secondary | ICD-10-CM | POA: Insufficient documentation

## 2021-02-09 DIAGNOSIS — Y92410 Unspecified street and highway as the place of occurrence of the external cause: Secondary | ICD-10-CM | POA: Insufficient documentation

## 2021-02-09 DIAGNOSIS — S199XXA Unspecified injury of neck, initial encounter: Secondary | ICD-10-CM | POA: Diagnosis present

## 2021-02-09 MED ORDER — METHOCARBAMOL 500 MG PO TABS: 500.0000 mg | ORAL_TABLET | Freq: Three times a day (TID) | ORAL | 0 refills | Status: AC | PRN
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
## 2021-02-12 ENCOUNTER — Telehealth: Payer: Self-pay

## 2021-02-12 NOTE — Telephone Encounter (Signed)
Transition Care Management Unsuccessful Follow-up Telephone Call  Date of discharge and from where:  02/09/2021 [facility]  Attempts:  1st Attempt  Reason for unsuccessful TCM follow-up call:  Unable to leave message

## 2021-02-13 NOTE — Telephone Encounter (Signed)
Transition Care Management Unsuccessful Follow-up Telephone Call  Date of discharge and from where:  02/09/2021 - [facility]  Attempts:  2nd Attempt  Reason for unsuccessful TCM follow-up call:  Unable to leave message

## 2021-02-14 DIAGNOSIS — M25561 Pain in right knee: Secondary | ICD-10-CM | POA: Diagnosis not present

## 2021-02-14 NOTE — Telephone Encounter (Signed)
Transition Care Management Unsuccessful Follow-up Telephone Call  Date of discharge and from where:  02/09/2021 - [facility]  Attempts:  3rd Attempt  Reason for unsuccessful TCM follow-up call:  Unable to leave message

## 2021-02-21 DIAGNOSIS — N751 Abscess of Bartholin's gland: Secondary | ICD-10-CM | POA: Diagnosis not present

## 2021-03-07 DIAGNOSIS — Z20828 Contact with and (suspected) exposure to other viral communicable diseases: Secondary | ICD-10-CM | POA: Diagnosis not present

## 2021-03-07 DIAGNOSIS — R0989 Other specified symptoms and signs involving the circulatory and respiratory systems: Secondary | ICD-10-CM | POA: Diagnosis not present

## 2021-03-07 DIAGNOSIS — R059 Cough, unspecified: Secondary | ICD-10-CM | POA: Diagnosis not present

## 2021-03-07 DIAGNOSIS — R52 Pain, unspecified: Secondary | ICD-10-CM | POA: Diagnosis not present

## 2021-03-07 DIAGNOSIS — R067 Sneezing: Secondary | ICD-10-CM | POA: Diagnosis not present

## 2021-04-03 DIAGNOSIS — R350 Frequency of micturition: Secondary | ICD-10-CM | POA: Diagnosis not present

## 2021-04-03 DIAGNOSIS — N898 Other specified noninflammatory disorders of vagina: Secondary | ICD-10-CM | POA: Diagnosis not present

## 2021-04-03 DIAGNOSIS — Z202 Contact with and (suspected) exposure to infections with a predominantly sexual mode of transmission: Secondary | ICD-10-CM | POA: Diagnosis not present

## 2021-05-21 DIAGNOSIS — J020 Streptococcal pharyngitis: Secondary | ICD-10-CM | POA: Diagnosis not present

## 2021-05-21 DIAGNOSIS — J0100 Acute maxillary sinusitis, unspecified: Secondary | ICD-10-CM | POA: Diagnosis not present

## 2021-06-10 DIAGNOSIS — Z3202 Encounter for pregnancy test, result negative: Secondary | ICD-10-CM | POA: Diagnosis not present

## 2021-06-10 DIAGNOSIS — M5459 Other low back pain: Secondary | ICD-10-CM | POA: Diagnosis not present

## 2021-06-12 ENCOUNTER — Telehealth: Payer: Self-pay

## 2021-06-12 NOTE — Telephone Encounter (Signed)
Transition Care Management Unsuccessful Follow-up Telephone Call  Date of discharge and from where:  06/11/2021[facility]  Attempts:  1st Attempt  Reason for unsuccessful TCM follow-up call:  Unable to reach patient

## 2021-06-13 NOTE — Telephone Encounter (Signed)
Transition Care Management Unsuccessful Follow-up Telephone Call  Date of discharge and from where:  [facility]   Attempts:  2nd Attempt  Reason for unsuccessful TCM follow-up call:  Unable to reach patient

## 2021-06-14 NOTE — Telephone Encounter (Signed)
Transition Care Management Unsuccessful Follow-up Telephone Call  Date of discharge and from where:  06/11/2021[facility]   Attempts:  3rd Attempt  Reason for unsuccessful TCM follow-up call:  Unable to reach patient

## 2021-10-26 ENCOUNTER — Other Ambulatory Visit: Payer: Self-pay | Admitting: Pediatrics

## 2021-11-06 ENCOUNTER — Other Ambulatory Visit: Payer: Self-pay | Admitting: Pediatrics
